# Patient Record
Sex: Female | Born: 1975 | Hispanic: No | Marital: Married | Smoking: Never smoker
Health system: Southern US, Community
[De-identification: ages and names within clinical notes are randomized; demographics above are authoritative.]

## PROBLEM LIST (undated history)

## (undated) HISTORY — PX: BREAST BIOPSY: SHX20

## (undated) HISTORY — PX: OTHER SURGICAL HISTORY: SHX169

---

## 2017-06-03 ENCOUNTER — Ambulatory Visit: Payer: Self-pay

## 2017-06-10 ENCOUNTER — Ambulatory Visit: Payer: Self-pay

## 2017-07-22 ENCOUNTER — Ambulatory Visit: Payer: BLUE CROSS/BLUE SHIELD | Attending: Oncology

## 2017-07-22 ENCOUNTER — Ambulatory Visit
Admission: RE | Admit: 2017-07-22 | Discharge: 2017-07-22 | Disposition: A | Discharge status: Home / Self Care | Payer: BLUE CROSS/BLUE SHIELD | Source: Ambulatory Visit | Attending: Oncology | Admitting: Oncology

## 2017-07-22 VITALS — Ht 62.0 in | Wt 113.0 lb

## 2017-07-22 DIAGNOSIS — Z0000 Encounter for general adult medical examination without abnormal findings: Secondary | ICD-10-CM

## 2017-07-22 NOTE — Progress Notes (Signed)
Subjective:     Patient ID: Leary RocaYanqin Trenkamp, female   DOB: November 21, 1975, 42 y.o.   MRN: [id number]  HPI   Review of Systems     Objective:   Physical Exam  Pulmonary/Chest: Right breast exhibits no inverted nipple, no mass, no nipple discharge, no skin change and no tenderness. Left breast exhibits no inverted nipple, no mass, no nipple discharge, no skin change and no tenderness. Breasts are symmetrical.  Bilateral center of nipple inversion.       Assessment:    42 year old asian patient presents for BCCCP clinic visit.  Patient speaks Mandarin.  Language line used to interpret exam. Patient screened, and meets BCCCP eligibility.  Patient does not have insurance, Medicare or Medicaid.  Handout given on Affordable Care Act.  Instructed patient on breast self awareness using teach back method.  CBE unremarkable.  No mass or lump palpated.  Patient reports nipples are inverted naturally, and she pulls them out.     Plan:     Sent for bilateral screening mammogram.  Courtesy car to transport patient.

## 2017-08-25 NOTE — Progress Notes (Signed)
Letter mailed from [facility] to notify of normal mammogram results.  Patient to return in one year for annual screening.  Copy to [facility]. 

## 2019-01-20 ENCOUNTER — Ambulatory Visit
Admission: EM | Admit: 2019-01-20 | Discharge: 2019-01-20 | Disposition: A | Discharge status: Home / Self Care | Payer: BLUE CROSS/BLUE SHIELD | Attending: Urgent Care | Admitting: Urgent Care

## 2019-01-20 ENCOUNTER — Encounter: Payer: Self-pay | Admitting: Emergency Medicine

## 2019-01-20 ENCOUNTER — Other Ambulatory Visit: Payer: Self-pay

## 2019-01-20 DIAGNOSIS — R197 Diarrhea, unspecified: Secondary | ICD-10-CM | POA: Diagnosis not present

## 2019-01-20 DIAGNOSIS — R1084 Generalized abdominal pain: Secondary | ICD-10-CM

## 2019-01-20 LAB — URINALYSIS, COMPLETE (UACMP) WITH MICROSCOPIC
Bacteria, UA: NONE SEEN
Bilirubin Urine: NEGATIVE
Glucose, UA: NEGATIVE mg/dL
Ketones, ur: NEGATIVE mg/dL
Leukocytes,Ua: NEGATIVE
Nitrite: NEGATIVE
Protein, ur: NEGATIVE mg/dL
Specific Gravity, Urine: 1.01 (ref 1.005–1.030)
pH: 7 (ref 5.0–8.0)

## 2019-01-20 NOTE — Discharge Instructions (Addendum)
It was very nice seeing you today in clinic. Thank you for entrusting me with your care.  ° °Please utilize the medications that we discussed. Your prescriptions have been called in to your pharmacy.  ° °If your symptoms/condition worsens, please seek follow up care either here or in the ER. Please remember, our [place] providers are "right here with you" when you need us.  ° °Again, it was my pleasure to take care of you today. Thank you for choosing our clinic. I hope that you start to feel better quickly.  ° °Kaileah Shevchenko, MSN, APRN, FNP-C, CEN °Advanced Practice Provider °[facility] °

## 2019-01-20 NOTE — ED Provider Notes (Signed)
[place], [place]   Name: Grace Boyer DOB: Nov 16, 1975 MRN: [id number] CSN: [id number] PCP: Patient, No Pcp Per  Arrival date and time:  01/20/19 1314  Chief Complaint:  Abdominal Pain and Diarrhea   NOTE: Prior to seeing the patient today, I have reviewed the triage nursing documentation and vital signs. Clinical staff has updated patient's PMH/PSHx, current medication list, and drug allergies/intolerances to ensure comprehensive history available to assist in medical decision making.   History:    History assisted by tele-interpreter Grace Boyer [phone]).  HPI: Grace Boyer is a 43 y.o. female who presents today with complaints of diarrhea for the last 3 weeks. Patient noting that soon after eating, she feels like she has to have a bowel movement, but she is only going very small amounts at a time.  Patient noting undigested food in her stools. She reports that her stools are dark in color; denies blood or mucus..  Patient complains of intermittent diffuse abdominal pain.  She denies any nausea, vomiting, fever, or chills.  She denies concurrent urinary symptoms. Patient has known PUD.  She was treated for H. pylori in [place] earlier this year prior to moving to [place].  Patient treated with "pills for 2 weeks", which initially improved her symptoms.  Since that time, patient has had intermittent diarrhea, however notes that  She has been stool small amounts daily for the last 3 weeks.  Patient unable to advise on the number of stools she is having on a daily basis. Patient notes that she has been eating and drinking well.  She denies resulting weakness related to her daily diarrhea.  Of note, patient has not established care with gastroenterology since moving to [facility] citing difficulties making an appointment due to her language barrier and current COVID pandemic. Patient has not had follow-up H. pylori eradication testing. Patient presents today requesting EGD and colonoscopy to be  performed.  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  History reviewed. No pertinent past medical history.  Past Surgical History:  Procedure Laterality Date   BREAST BIOPSY Right    neg   BREAST BIOPSY Left    neg    Family History  Problem Relation Age of Onset   Healthy Mother    Healthy Father    Breast cancer Neg Hx     Social History   Tobacco Use   Smoking status: Never Smoker   Smokeless tobacco: Never Used  Substance Use Topics   Alcohol use: Not Currently   Drug use: Not Currently    There are no active problems to display for this patient.   Home Medications:    No outpatient medications have been marked as taking for the 01/20/19 encounter [facility] Encounter).    Allergies:   Patient has no known allergies.  Review of Systems (ROS): Review of Systems  Constitutional: Negative for fatigue and fever.  HENT: Negative for congestion, ear pain, postnasal drip, rhinorrhea, sinus pressure, sinus pain, sneezing and sore throat.   Eyes: Negative for pain, discharge and redness.  Respiratory: Negative for cough, chest tightness and shortness of breath.   Cardiovascular: Negative for chest pain and palpitations.  Gastrointestinal: Positive for abdominal pain and diarrhea. Negative for nausea and vomiting.  Musculoskeletal: Negative for arthralgias, back pain, myalgias and neck pain.  Skin: Negative for color change, pallor and rash.  Neurological: Negative for dizziness, syncope, weakness and  headaches.  Hematological: Negative for adenopathy.  All other systems reviewed and are negative.    Vital Signs: Today's Vitals   01/20/19 1358 01/20/19 1405 01/20/19 1515  BP:  120/83   Pulse:  95   Resp:  18   Temp:  98.7 F (37.1 C)   TempSrc:  Oral   SpO2:  100%   Weight: 115 lb (52.2 kg)    Height: 5\' 1"  (1.549 m)    PainSc: 0-No pain  0-No pain     Physical Exam: Physical Exam  Constitutional: She is oriented to person, place, and time and well-developed, well-nourished, and in no distress. No distress.  HENT:  Head: Normocephalic and atraumatic.  Nose: Nose normal.  Mouth/Throat: Oropharynx is clear and moist.  Eyes: Pupils are equal, round, and reactive to light. Conjunctivae and EOM are normal.  Neck: Normal range of motion. Neck supple. No tracheal deviation present.  Cardiovascular: Normal rate, regular rhythm, normal heart sounds and intact distal pulses. Exam reveals no gallop and no friction rub.  No murmur heard. Pulmonary/Chest: Effort normal and breath sounds normal. No respiratory distress. She has no wheezes. She has no rales.  Abdominal: Soft. Bowel sounds are normal. She exhibits no distension. There is no abdominal tenderness.  Musculoskeletal: Normal range of motion.  Lymphadenopathy:    She has no cervical adenopathy.  Neurological: She is alert and oriented to person, place, and time. Gait normal. GCS score is 15.  Skin: Skin is warm and dry. No rash noted. She is not diaphoretic.  Psychiatric: Mood, memory, affect and judgment normal.  Nursing note and vitals reviewed.   Urgent Care Treatments / Results:   LABS: PLEASE NOTE: all labs that were ordered this encounter are listed, however only abnormal results are displayed. Labs Reviewed  URINALYSIS, COMPLETE (UACMP) WITH MICROSCOPIC - Abnormal; Notable for the following components:      Result Value   Color, Urine STRAW (*)    Hgb urine dipstick MODERATE (*)    All other components within normal limits    EKG: -None  RADIOLOGY: No results found.  PROCEDURES: Procedures  MEDICATIONS RECEIVED THIS VISIT: Medications - No data to display  PERTINENT CLINICAL COURSE NOTES/UPDATES:   Initial Impression / Assessment and Plan / Urgent Care Course:  Pertinent labs & imaging results that were available during my care of the patient were personally  reviewed by me and considered in my medical decision making (see lab/imaging section of note for values and interpretations).  Grace Boyer is a 43 y.o. female who presents to [facility] today with complaints of Abdominal Pain and Diarrhea   Patient is well appearing overall in clinic today. She does not appear to be in any acute distress. Presenting symptoms (see HPI) and exam as documented above.  Consult with this patient was very difficult today due to language barrier; patient only speaks [place]and[place] Chinese.  HPI and assessment was facilitated by tele-interpreter.  Patient notes that she is not having abdominal pain in clinic today.  She does not appear to be dehydrated; eating and drinking well. She is hemodynamically stable; no tachycardia or hypotension. We attempted to collect stool studies to assess for diarrhea of infectious etiology (C. difficile, GI panel, H. pylori stool Ag), however patient unable to provide sample in clinic.  Patient advised that she will need to collect stool studies at home and return them to the clinic for testing.  She was provided with a written prescription for the stool studies.  She was advised that treatment for her diarrhea would be deferred until test results received. Encouraged to avoid anti-diarrheals until stool studies reviewed. In the interim, patient was encouraged to increase fluid intake using electrolyte enriched fluids.  Additionally, she was encouraged to increase her intake of potassium rich foods; sources reviewed in clinic with patient.  Patient verbalized understanding.    Discussed with patient that she will need to be seen in consult by gastroenterology for consideration of EGD and colonoscopy.  Patient asking for EGD and colonoscopy to be performed here in this clinic.  Discussed nature of the urgent care and services provided.  Patient was advised that she will need to schedule a consult appointment with gastroenterology for consideration  of EGD and colonoscopy.  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  Call placed to [facility] to secure patient appointment.  Providers graciously agreed to see patient on Monday, 01/26/2019 at 130 in the [place] office.  Patient will be seeing Dr. Wyline MoodKiran Anna.  Patient was advised to discuss follow-up on pancreatic cyst with GI provider at the time of her appointment.  I have reviewed the follow up and strict return precautions for any new or worsening symptoms. Patient is aware of symptoms that would be deemed urgent/emergent, and would thus require further evaluation either here or in the emergency department. At the time of discharge, she verbalized understanding and consent with the discharge plan as it was reviewed with her. All questions were fielded by provider and/or clinic staff prior to patient discharge.    Final Clinical Impressions / Urgent Care Diagnoses:   Final diagnoses:  Diarrhea, unspecified type  Generalized abdominal pain    New Prescriptions:  [place] Controlled Substance Registry consulted? Not Applicable  No orders of the defined types were placed in this encounter.   Recommended Follow up Care:  Patient encouraged to follow up with the following provider within the specified time frame, or sooner as dictated by the severity of her symptoms. As always, she was instructed that for any urgent/emergent care needs, she should seek care either here or in the emergency department for more immediate evaluation.  Follow-up Information    Wyline MoodAnna, Kiran, MD.   Specialty: Gastroenterology Why: Your have an appointment at 1:30 Contact information: [address] [place] [address] [phone]         NOTE: This note was prepared using Dragon dictation software along with smaller phrase technology. Despite my best ability to proofread, there is the potential that  transcriptional errors may still occur from this process, and are completely unintentional.    Verlee MonteGray, Destini Cambre E, NP 01/22/19 [id number]

## 2019-01-20 NOTE — ED Triage Notes (Signed)
Pt c/o diarrhea for about 3 weeks. She states that after she eats she feels like she needs to have a BM again and does not feel empty. She also noticed leaves in her stool. She also noticed that her stools look black. She has pain in the center of her abdomen. She had H pylori earlier in the year and was suppose to follow up in february but was not able to follow up due to covid.

## 2019-01-26 ENCOUNTER — Ambulatory Visit: Payer: BLUE CROSS/BLUE SHIELD | Admitting: Gastroenterology

## 2019-01-26 ENCOUNTER — Other Ambulatory Visit: Payer: Self-pay

## 2019-01-26 NOTE — Progress Notes (Deleted)
   Jonathon Bellows MD, MRCP(U.K) [address]  [place]  [place], [place] [address]  Main: [phone]  Fax: [phone]   Gastroenterology Consultation  Referring Provider:     Karen Kitchens, NP Primary Care Physician:  Patient, No Pcp Per Primary Gastroenterologist:  Dr. Jonathon Bellows  Reason for Consultation:     Diarrhea         HPI:   Annalisia Ingber is a 43 y.o. y/o female referred for Diarrhea. Speaks only Mandarin . H/o H pylori.  Seen at urgent care on 01/20/2019.   Treated in [place] in early 2020 for H pylori . Prior cyst on pancreas and was advised to follow up   No past medical history on file.  Past Surgical History:  Procedure Laterality Date  . BREAST BIOPSY Right    neg  . BREAST BIOPSY Left    neg    Prior to Admission medications   Not on File    Family History  Problem Relation Age of Onset  . Healthy Mother   . Healthy Father   . Breast cancer Neg Hx      Social History   Tobacco Use  . Smoking status: Never Smoker  . Smokeless tobacco: Never Used  Substance Use Topics  . Alcohol use: Not Currently  . Drug use: Not Currently    Allergies as of 01/26/2019  . (No Known Allergies)    Review of Systems:    All systems reviewed and negative except where noted in HPI.   Physical Exam:  LMP 01/16/2019 (Approximate)  Patient's last menstrual period was 01/16/2019 (approximate). Psych:  Alert and cooperative. Normal mood and affect. General:   Alert,  Well-developed, well-nourished, pleasant and cooperative in NAD Head:  Normocephalic and atraumatic. Eyes:  Sclera clear, no icterus.   Conjunctiva pink. Ears:  Normal auditory acuity. Nose:  No deformity, discharge, or lesions. Mouth:  No deformity or lesions,oropharynx pink & moist. Neck:  Supple; no masses or thyromegaly. Lungs:  Respirations even and unlabored.  Clear throughout to auscultation.   No wheezes, crackles, or rhonchi. No acute distress. Heart:  Regular rate and rhythm; no  murmurs, clicks, rubs, or gallops. Abdomen:  Normal bowel sounds.  No bruits.  Soft, non-tender and non-distended without masses, hepatosplenomegaly or hernias noted.  No guarding or rebound tenderness.    Neurologic:  Alert and oriented x3;  grossly normal neurologically. Skin:  Intact without significant lesions or rashes. No jaundice. Lymph Nodes:  No significant cervical adenopathy. Psych:  Alert and cooperative. Normal mood and affect.  Imaging Studies: No results found.  Assessment and Plan:   Phala Schraeder is a 43 y.o. y/o female has been referred for diarrhea. Previously been treated for H pylori - eradication not confirmed, abdominal pain and recalls having a pancreatic cyst that needs follow up    1. Pancreatic cyst : CT scan of the abdomen Pancreatic mass protocol  2. Diarrhea : Stool for C diff, PCR calprotectin  3. H pylori breath test in view of prior infection  4. EGD+colonoscopy to evaluate abdominal pain and diarrhea    Follow up in 6 weeks  Dr Jonathon Bellows MD,MRCP(U.K)

## 2019-01-28 ENCOUNTER — Other Ambulatory Visit
Admission: RE | Admit: 2019-01-28 | Discharge: 2019-01-28 | Disposition: A | Discharge status: Home / Self Care | Payer: BLUE CROSS/BLUE SHIELD | Source: Ambulatory Visit | Attending: Gastroenterology | Admitting: Gastroenterology

## 2019-01-28 DIAGNOSIS — R197 Diarrhea, unspecified: Secondary | ICD-10-CM | POA: Insufficient documentation

## 2019-01-28 DIAGNOSIS — R1084 Generalized abdominal pain: Secondary | ICD-10-CM | POA: Diagnosis not present

## 2019-01-28 LAB — C DIFFICILE QUICK SCREEN W PCR REFLEX
C Diff antigen: POSITIVE — AB
C Diff toxin: NEGATIVE

## 2019-01-28 LAB — CLOSTRIDIUM DIFFICILE BY PCR, REFLEXED: Toxigenic C. Difficile by PCR: NEGATIVE

## 2019-02-17 ENCOUNTER — Other Ambulatory Visit (HOSPITAL_COMMUNITY): Payer: Self-pay | Admitting: Gastroenterology

## 2019-02-17 ENCOUNTER — Ambulatory Visit: Payer: BLUE CROSS/BLUE SHIELD | Admitting: Family Medicine

## 2019-02-17 ENCOUNTER — Other Ambulatory Visit: Payer: Self-pay | Admitting: Gastroenterology

## 2019-02-17 DIAGNOSIS — R197 Diarrhea, unspecified: Secondary | ICD-10-CM

## 2019-02-17 DIAGNOSIS — R1084 Generalized abdominal pain: Secondary | ICD-10-CM

## 2019-03-03 ENCOUNTER — Other Ambulatory Visit: Payer: Self-pay

## 2019-03-03 ENCOUNTER — Ambulatory Visit
Admission: RE | Admit: 2019-03-03 | Discharge: 2019-03-03 | Disposition: A | Discharge status: Home / Self Care | Payer: BLUE CROSS/BLUE SHIELD | Source: Ambulatory Visit | Attending: Gastroenterology | Admitting: Gastroenterology

## 2019-03-03 ENCOUNTER — Other Ambulatory Visit: Payer: Self-pay | Admitting: Nurse Practitioner

## 2019-03-03 DIAGNOSIS — R1084 Generalized abdominal pain: Secondary | ICD-10-CM | POA: Insufficient documentation

## 2019-03-03 DIAGNOSIS — Z1231 Encounter for screening mammogram for malignant neoplasm of breast: Secondary | ICD-10-CM

## 2019-03-03 DIAGNOSIS — R197 Diarrhea, unspecified: Secondary | ICD-10-CM | POA: Insufficient documentation

## 2019-03-03 MED ORDER — IOHEXOL 300 MG/ML  SOLN
75.0000 mL | Freq: Once | INTRAMUSCULAR | Status: AC | PRN
  Administered 2019-03-03: 09:00:00 75 mL via INTRAVENOUS

## 2019-03-11 ENCOUNTER — Ambulatory Visit
Admission: RE | Admit: 2019-03-11 | Discharge: 2019-03-11 | Disposition: A | Discharge status: Home / Self Care | Payer: BLUE CROSS/BLUE SHIELD | Source: Ambulatory Visit | Attending: Nurse Practitioner | Admitting: Nurse Practitioner

## 2019-03-11 ENCOUNTER — Other Ambulatory Visit: Payer: Self-pay

## 2019-03-11 DIAGNOSIS — Z1231 Encounter for screening mammogram for malignant neoplasm of breast: Secondary | ICD-10-CM | POA: Diagnosis not present

## 2019-04-03 ENCOUNTER — Other Ambulatory Visit: Payer: Self-pay

## 2019-04-03 ENCOUNTER — Other Ambulatory Visit
Admission: RE | Admit: 2019-04-03 | Discharge: 2019-04-03 | Disposition: A | Discharge status: Home / Self Care | Payer: BLUE CROSS/BLUE SHIELD | Source: Ambulatory Visit | Attending: Internal Medicine | Admitting: Internal Medicine

## 2019-04-03 DIAGNOSIS — Z01812 Encounter for preprocedural laboratory examination: Secondary | ICD-10-CM | POA: Insufficient documentation

## 2019-04-03 DIAGNOSIS — Z20828 Contact with and (suspected) exposure to other viral communicable diseases: Secondary | ICD-10-CM | POA: Insufficient documentation

## 2019-04-03 LAB — SARS CORONAVIRUS 2 (TAT 6-24 HRS): SARS Coronavirus 2: NEGATIVE

## 2019-04-08 ENCOUNTER — Ambulatory Visit
Admission: RE | Admit: 2019-04-08 | Discharge: 2019-04-08 | Disposition: A | Discharge status: Home / Self Care | Payer: BLUE CROSS/BLUE SHIELD | Attending: Internal Medicine | Admitting: Internal Medicine

## 2019-04-08 ENCOUNTER — Ambulatory Visit: Payer: BLUE CROSS/BLUE SHIELD | Admitting: Anesthesiology

## 2019-04-08 ENCOUNTER — Other Ambulatory Visit: Payer: Self-pay

## 2019-04-08 ENCOUNTER — Encounter: Payer: Self-pay | Admitting: *Deleted

## 2019-04-08 ENCOUNTER — Encounter
Admission: RE | Disposition: A | Discharge status: Home / Self Care | Payer: Self-pay | Source: Home / Self Care | Attending: Internal Medicine

## 2019-04-08 DIAGNOSIS — K529 Noninfective gastroenteritis and colitis, unspecified: Secondary | ICD-10-CM | POA: Insufficient documentation

## 2019-04-08 DIAGNOSIS — K219 Gastro-esophageal reflux disease without esophagitis: Secondary | ICD-10-CM | POA: Insufficient documentation

## 2019-04-08 DIAGNOSIS — R1013 Epigastric pain: Secondary | ICD-10-CM | POA: Insufficient documentation

## 2019-04-08 DIAGNOSIS — A0472 Enterocolitis due to Clostridium difficile, not specified as recurrent: Secondary | ICD-10-CM | POA: Diagnosis not present

## 2019-04-08 HISTORY — PX: ESOPHAGOGASTRODUODENOSCOPY (EGD) WITH PROPOFOL: SHX5813

## 2019-04-08 HISTORY — PX: COLONOSCOPY WITH PROPOFOL: SHX5780

## 2019-04-08 LAB — POCT PREGNANCY, URINE: Preg Test, Ur: NEGATIVE

## 2019-04-08 SURGERY — COLONOSCOPY WITH PROPOFOL
Anesthesia: General

## 2019-04-08 MED ORDER — MIDAZOLAM HCL 2 MG/2ML IJ SOLN
INTRAMUSCULAR | Status: DC | PRN
  Administered 2019-04-08: 2 mg via INTRAVENOUS

## 2019-04-08 MED ORDER — LIDOCAINE HCL (PF) 1 % IJ SOLN
INTRAMUSCULAR | Status: AC
  Administered 2019-04-08: 09:00:00 0.5 mL
  Filled 2019-04-08: qty 2

## 2019-04-08 MED ORDER — FENTANYL CITRATE (PF) 100 MCG/2ML IJ SOLN
INTRAMUSCULAR | Status: AC
  Filled 2019-04-08: qty 2

## 2019-04-08 MED ORDER — FENTANYL CITRATE (PF) 100 MCG/2ML IJ SOLN
INTRAMUSCULAR | Status: DC | PRN
  Administered 2019-04-08: 50 ug via INTRAVENOUS

## 2019-04-08 MED ORDER — PHENYLEPHRINE HCL (PRESSORS) 10 MG/ML IV SOLN
INTRAVENOUS | Status: DC | PRN
  Administered 2019-04-08 (×3): 100 ug via INTRAVENOUS
  Administered 2019-04-08: 50 ug via INTRAVENOUS
  Administered 2019-04-08: 100 ug via INTRAVENOUS

## 2019-04-08 MED ORDER — PROPOFOL 500 MG/50ML IV EMUL
INTRAVENOUS | Status: DC | PRN
  Administered 2019-04-08: 85 ug/kg/min via INTRAVENOUS

## 2019-04-08 MED ORDER — ONDANSETRON HCL 4 MG/2ML IJ SOLN
INTRAMUSCULAR | Status: DC | PRN
  Administered 2019-04-08: 4 mg via INTRAVENOUS

## 2019-04-08 MED ORDER — MIDAZOLAM HCL 2 MG/2ML IJ SOLN
INTRAMUSCULAR | Status: AC
  Filled 2019-04-08: qty 2

## 2019-04-08 MED ORDER — SODIUM CHLORIDE 0.9 % IV SOLN
INTRAVENOUS | Status: DC
  Administered 2019-04-08: 09:00:00 via INTRAVENOUS

## 2019-04-08 MED ORDER — PROPOFOL 10 MG/ML IV BOLUS
INTRAVENOUS | Status: DC | PRN
  Administered 2019-04-08: 50 mg via INTRAVENOUS

## 2019-04-08 NOTE — Op Note (Signed)
[facility] Gastroenterology Patient Name: Grace Boyer Procedure Date: 04/08/2019 9:27 AM MRN: [id number] Account #: [id number] Date of Birth: [birth year] Admit Type: Outpatient Age: 43 Room: [facility] ENDO ROOM 3 Gender: Female Note Status: Finalized Procedure:             Upper GI endoscopy Indications:           Epigastric abdominal pain Providers:             Benay Pike. [place] MD, MD Medicines:             Propofol per Anesthesia Complications:         No immediate complications. Procedure:             Pre-Anesthesia Assessment:                        - The risks and benefits of the procedure and the                         sedation options and risks were discussed with the                         patient. All questions were answered and informed                         consent was obtained.                        - Patient identification and proposed procedure were                         verified prior to the procedure by the nurse. The                         procedure was verified in the procedure room.                        - ASA Grade Assessment: II - A patient with mild                         systemic disease.                        - After reviewing the risks and benefits, the patient                         was deemed in satisfactory condition to undergo the                         procedure.                        After obtaining informed consent, the endoscope was                         passed under direct vision. Throughout the procedure,                         the patient's blood pressure, pulse, and oxygen  saturations were monitored continuously. The Endoscope                         was introduced through the mouth, and advanced to the                         third part of duodenum. The upper GI endoscopy was                         accomplished without difficulty. The patient tolerated                         the  procedure well. Findings:      The esophagus was normal.      The stomach was normal.      The examined duodenum was normal. Impression:            - Normal esophagus.                        - Normal stomach.                        - Normal examined duodenum.                        - No specimens collected. Recommendation:        - Proceed with colonoscopy Procedure Code(s):     --- Professional ---                        [phone], Esophagogastroduodenoscopy, flexible,                         transoral; diagnostic, including collection of                         specimen(s) by brushing or washing, when performed                         (separate procedure) Diagnosis Code(s):     --- Professional ---                        R10.13, Epigastric pain CPT copyright 2019 American Medical Association. All rights reserved. The codes documented in this report are preliminary and upon coder review may  be revised to meet current compliance requirements. Stanton Kidney MD, MD 04/08/2019 9:48:21 AM This report has been signed electronically. Number of Addenda: 0 Note Initiated On: 04/08/2019 9:27 AM Estimated Blood Loss:  Estimated blood loss: none.      [facility]

## 2019-04-08 NOTE — Transfer of Care (Signed)
Immediate Anesthesia Transfer of Care Note  Patient: Grace Boyer  Procedure(s) Performed: COLONOSCOPY WITH PROPOFOL (N/A ) ESOPHAGOGASTRODUODENOSCOPY (EGD) WITH PROPOFOL (N/A )  Patient Location: PACU  Anesthesia Type:General  Level of Consciousness: oriented and sedated  Airway & Oxygen Therapy: Patient Spontanous Breathing  Post-op Assessment: Report given to RN and Post -op Vital signs reviewed and stable  Post vital signs: Reviewed and stable  Last Vitals:  Vitals Value Taken Time  BP    Temp    Pulse 66 04/08/19 1006  Resp 19 04/08/19 1006  SpO2 98 % 04/08/19 1006  Vitals shown include unvalidated device data.  Last Pain: There were no vitals filed for this visit.       Complications: No apparent anesthesia complications

## 2019-04-08 NOTE — Progress Notes (Signed)
Patient complained of feeling dizzy after getting dressed. She was instructed to rest a bit longer on the stretcher and was provided a drink and graham crackers. Mandarin speaking interpreter remained present during this time. After about 10 minutes she stated that she was better and was ready to leave. She was discharged via wheelchair.

## 2019-04-08 NOTE — Interval H&P Note (Signed)
History and Physical Interval Note:  04/08/2019 8:37 AM  Grace Boyer  has presented today for surgery, with the diagnosis of diarrhea, abdominal pain.  The various methods of treatment have been discussed with the patient and family. After consideration of risks, benefits and other options for treatment, the patient has consented to  Procedure(s): COLONOSCOPY WITH PROPOFOL (N/A) ESOPHAGOGASTRODUODENOSCOPY (EGD) WITH PROPOFOL (N/A) as a surgical intervention.  The patient's history has been reviewed, patient examined, no change in status, stable for surgery.  I have reviewed the patient's chart and labs.  Questions were answered to the patient's satisfaction.     [place], [place]

## 2019-04-08 NOTE — Anesthesia Post-op Follow-up Note (Signed)
Anesthesia QCDR form completed.        

## 2019-04-08 NOTE — Anesthesia Postprocedure Evaluation (Signed)
Anesthesia Post Note  Patient: Grace Boyer  Procedure(s) Performed: COLONOSCOPY WITH PROPOFOL (N/A ) ESOPHAGOGASTRODUODENOSCOPY (EGD) WITH PROPOFOL (N/A )  Patient location during evaluation: Endoscopy Anesthesia Type: General Level of consciousness: awake and alert and oriented Pain management: pain level controlled Vital Signs Assessment: post-procedure vital signs reviewed and stable Respiratory status: spontaneous breathing Cardiovascular status: blood pressure returned to baseline Anesthetic complications: no     Last Vitals:  Vitals:   04/08/19 0859 04/08/19 1008  BP: 121/89 (!) 86/56  Pulse: 85 65  Resp: 16 19  Temp: (!) 36.2 C 36.4 C  SpO2: 100% 99%    Last Pain:  Vitals:   04/08/19 1038  TempSrc:   PainSc: 0-No pain                 Euphemia Lingerfelt

## 2019-04-08 NOTE — H&P (Signed)
Outpatient short stay form Pre-procedure 04/08/2019 8:36 AM Grace K. Alice Reichert, M.D.  Primary Physician: Gaetano Net, M.D.  Reason for visit:  Chronic diarrhea, Epigastric pain.  History of present illness:  As above. 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 Still having some diarrhea, not totally resolved. Has epigastric pain without melena.    No current facility-administered medications for this encounter.   Medications Prior to Admission  Medication Sig Dispense Refill Last Dose  . pantoprazole (PROTONIX) 20 MG tablet Take 20 mg by mouth daily.        No Known Allergies   No past medical history on file.  Review of systems:  Otherwise negative.    Physical Exam  Gen: Alert, oriented. Appears stated age.  HEENT: [place]/AT. PERRLA. Lungs: CTA, no wheezes. CV: RR nl S1, S2. Abd: soft, benign, no masses. BS+ Ext: No edema. Pulses 2+    Planned procedures: Proceed with EGD and colonoscopy. The patient understands the nature of the planned procedure, indications, risks, alternatives and potential complications including but not limited to bleeding, infection, perforation, damage to internal organs and possible oversedation/side effects from anesthesia. The patient agrees and gives consent to proceed.  Please refer to procedure notes for findings, recommendations and patient disposition/instructions.     Grace K. Alice Reichert, M.D. Gastroenterology 04/08/2019  8:36 AM

## 2019-04-08 NOTE — Anesthesia Preprocedure Evaluation (Signed)
Anesthesia Evaluation  Patient identified by MRN, date of birth, ID band Patient awake    Reviewed: Allergy & Precautions, NPO status , Patient's Chart, lab work & pertinent test results  Airway Mallampati: II  TM Distance: >3 FB     Dental  (+) Teeth Intact   Pulmonary neg pulmonary ROS,    Pulmonary exam normal        Cardiovascular negative cardio ROS Normal cardiovascular exam     Neuro/Psych negative neurological ROS  negative psych ROS   GI/Hepatic Neg liver ROS, GERD  ,  Endo/Other  negative endocrine ROS  Renal/GU negative Renal ROS  negative genitourinary   Musculoskeletal negative musculoskeletal ROS (+)   Abdominal Normal abdominal exam  (+)   Peds negative pediatric ROS (+)  Hematology negative hematology ROS (+)   Anesthesia Other Findings   Reproductive/Obstetrics                             Anesthesia Physical Anesthesia Plan  ASA: II  Anesthesia Plan: General   Post-op Pain Management:    Induction: Intravenous  PONV Risk Score and Plan: Propofol infusion  Airway Management Planned: Nasal Cannula  Additional Equipment:   Intra-op Plan:   Post-operative Plan:   Informed Consent: I have reviewed the patients History and Physical, chart, labs and discussed the procedure including the risks, benefits and alternatives for the proposed anesthesia with the patient or authorized representative who has indicated his/her understanding and acceptance.     Dental advisory given  Plan Discussed with: CRNA and Surgeon  Anesthesia Plan Comments:         Anesthesia Quick Evaluation

## 2019-04-08 NOTE — Op Note (Signed)
[facility] Patient Name: Grace Boyer Procedure Date: 04/08/2019 9:26 AM MRN: [id number] Account #: [id number] Date of Birth: Apr 22, 1976 Admit Type: Outpatient Age: 43 Room: [facility] ENDO ROOM 3 Gender: Female Note Status: Finalized Procedure:             Colonoscopy Indications:           Functional diarrhea, Follow-up of pseudomembranous                         colitis Providers:             Benay Pike. Lanaiya Lantry MD, MD Medicines:             Propofol per Anesthesia Complications:         No immediate complications. Procedure:             Pre-Anesthesia Assessment:                        - The risks and benefits of the procedure and the                         sedation options and risks were discussed with the                         patient. All questions were answered and informed                         consent was obtained.                        - Patient identification and proposed procedure were                         verified prior to the procedure by the nurse. The                         procedure was verified in the procedure room.                        - ASA Grade Assessment: I - A normal, healthy patient.                        - After reviewing the risks and benefits, the patient                         was deemed in satisfactory condition to undergo the                         procedure.                        After obtaining informed consent, the colonoscope was                         passed under direct vision. Throughout the procedure,                         the patient's blood pressure, pulse, and oxygen  saturations were monitored continuously. The                         Colonoscope was introduced through the anus and                         advanced to the the terminal ileum, with                         identification of the appendiceal orifice and IC                         valve. The colonoscopy was  performed without                         difficulty. The patient tolerated the procedure well.                         The quality of the bowel preparation was excellent.                         The terminal ileum, ileocecal valve, appendiceal                         orifice, and rectum were photographed. Findings:      The perianal and digital rectal examinations were normal. Pertinent       negatives include normal sphincter tone and no palpable rectal lesions.      The entire examined colon appeared normal on direct and retroflexion       views.      The terminal ileum appeared normal. Biopsies were taken with a cold       forceps for histology.      Biopsies for histology were taken with a cold forceps from the right       colon, left colon and rectum for evaluation of microscopic colitis. Impression:            - The entire examined colon is normal on direct and                         retroflexion views.                        - The examined portion of the ileum was normal.                         Biopsied.                        - Biopsies were taken with a cold forceps from the                         right colon, left colon and rectum for evaluation of                         microscopic colitis. Recommendation:        - Patient has a contact number available for                         emergencies.  The signs and symptoms of potential                         delayed complications were discussed with the patient.                         Return to normal activities tomorrow. Written                         discharge instructions were provided to the patient.                        - Resume previous diet.                        - Continue present medications.                        - Await pathology results.                        - Repeat colonoscopy in 7 years for screening purposes.                        - Return to physician assistant PRN.                        - The  findings and recommendations were discussed with                         the patient. 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 All rights reserved. The codes documented in this report are preliminary and upon coder review may  be revised to meet current compliance requirements. Stanton Kidneyeodoro K Berlin Viereck MD, MD 04/08/2019 10:03:35 AM This report has been signed electronically. Number of Addenda: 0 Note Initiated On: 04/08/2019 9:26 AM Scope Withdrawal Time: 0 hours 4 minutes 59 seconds  Total Procedure Duration: 0 hours 8 minutes 12 seconds  Estimated Blood Loss:  Estimated blood loss: none.      [facility]

## 2019-04-09 ENCOUNTER — Encounter: Payer: Self-pay | Admitting: Internal Medicine

## 2019-04-09 LAB — SURGICAL PATHOLOGY

## 2020-03-14 ENCOUNTER — Other Ambulatory Visit: Payer: Self-pay | Admitting: Nurse Practitioner

## 2020-03-14 DIAGNOSIS — Z1231 Encounter for screening mammogram for malignant neoplasm of breast: Secondary | ICD-10-CM

## 2020-04-19 ENCOUNTER — Ambulatory Visit
Admission: RE | Admit: 2020-04-19 | Discharge: 2020-04-19 | Disposition: A | Discharge status: Home / Self Care | Payer: BLUE CROSS/BLUE SHIELD | Source: Ambulatory Visit | Attending: Nurse Practitioner | Admitting: Nurse Practitioner

## 2020-04-19 ENCOUNTER — Encounter (INDEPENDENT_AMBULATORY_CARE_PROVIDER_SITE_OTHER): Payer: Self-pay

## 2020-04-19 ENCOUNTER — Other Ambulatory Visit: Payer: Self-pay

## 2020-04-19 DIAGNOSIS — Z1231 Encounter for screening mammogram for malignant neoplasm of breast: Secondary | ICD-10-CM | POA: Diagnosis not present

## 2020-08-20 IMAGING — MG MM DIGITAL SCREENING BILAT W/ TOMO W/ CAD
8 series · 9 of 24 positions shown · non-contrast
Comparison: Previous exam(s).

CLINICAL DATA: Screening.

EXAM:
DIGITAL SCREENING BILATERAL MAMMOGRAM WITH TOMO AND CAD

[L MLO synth-2D]
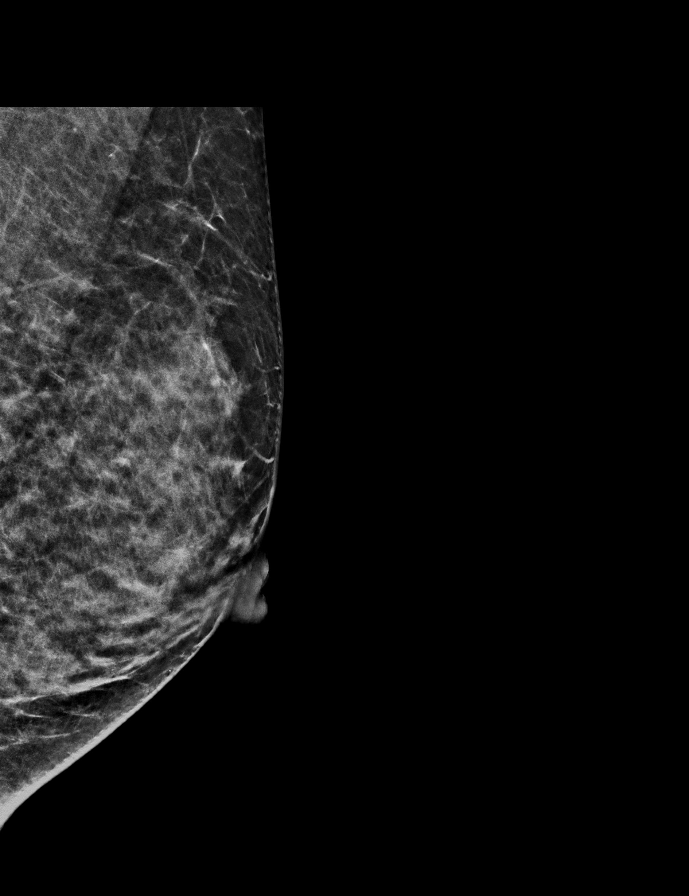

[L CC synth-2D]
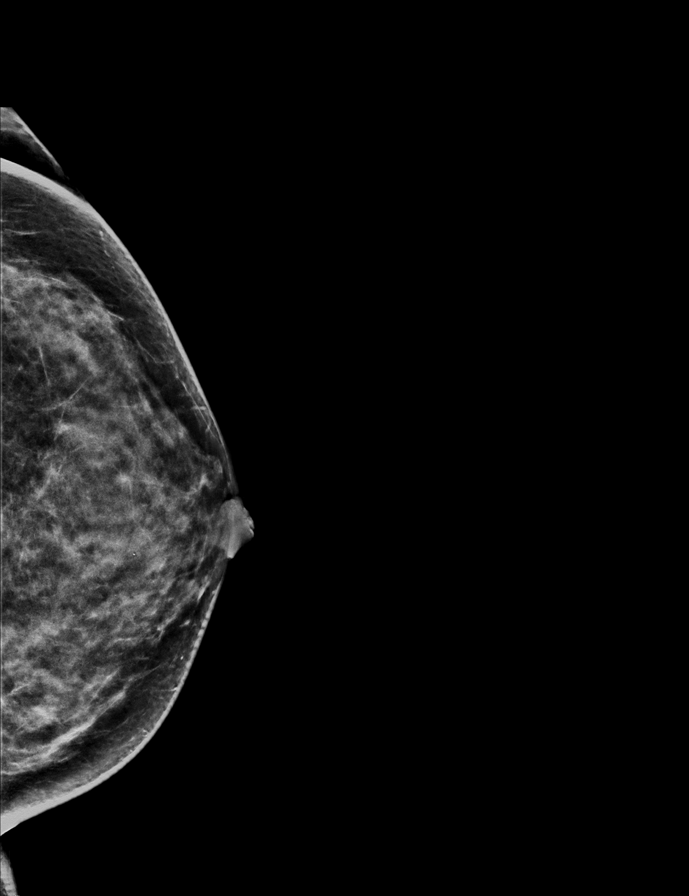

[R MLO synth-2D]
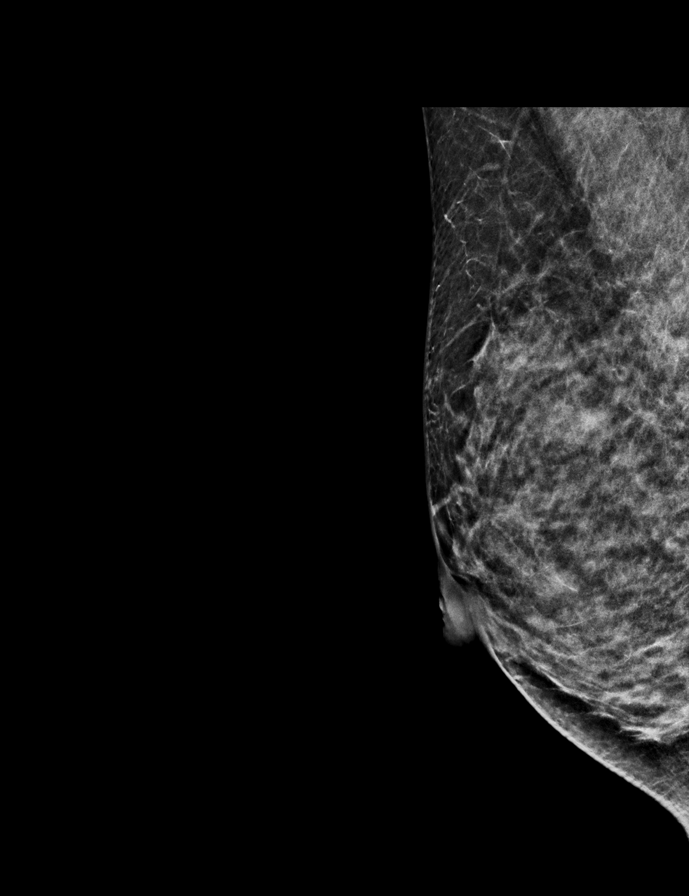

[R CC synth-2D]
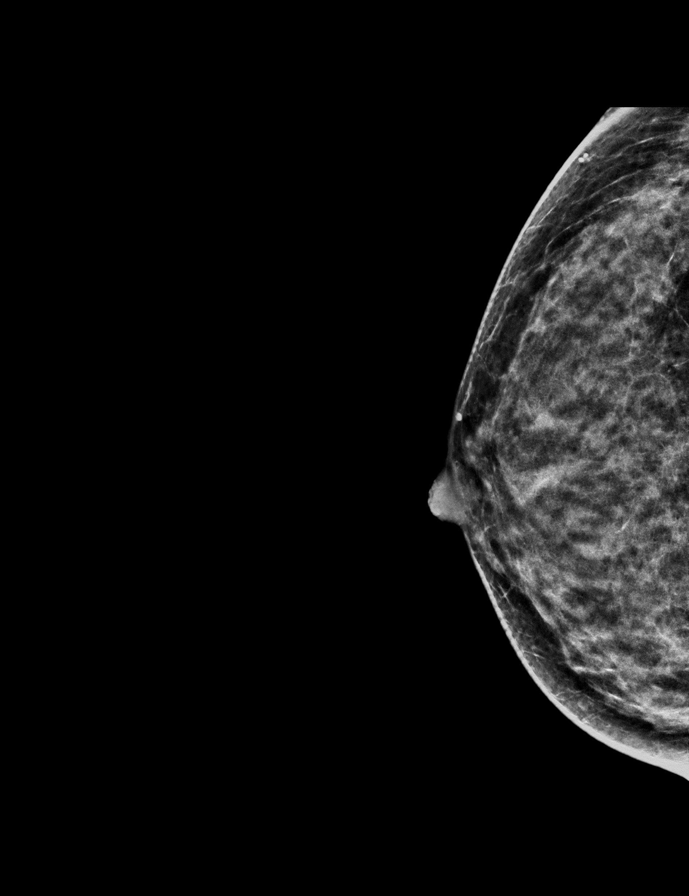

[L MLO tomo · 2 of 52 frames shown]
[frame 17/52]
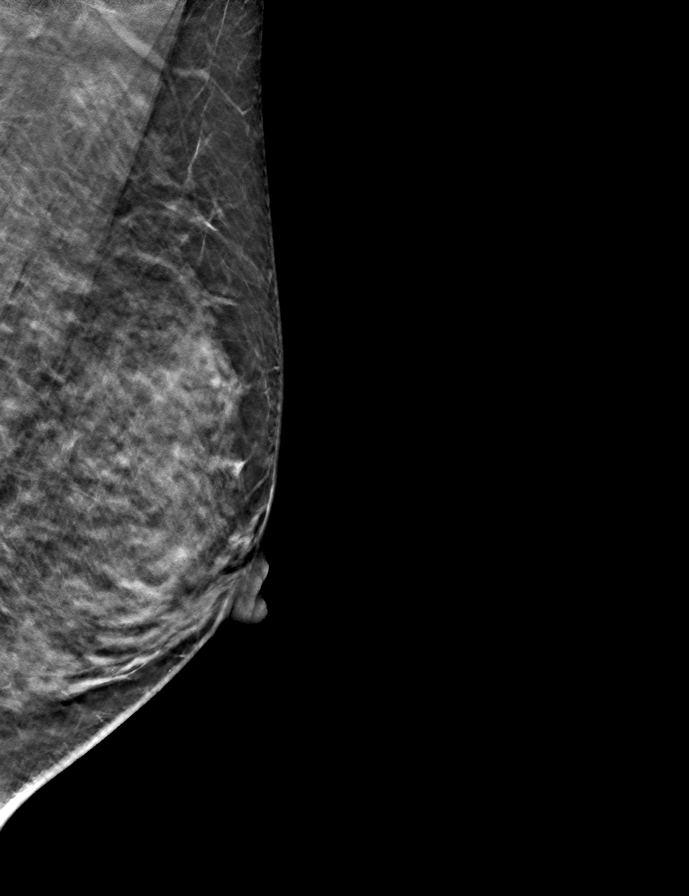
[frame 27/52]
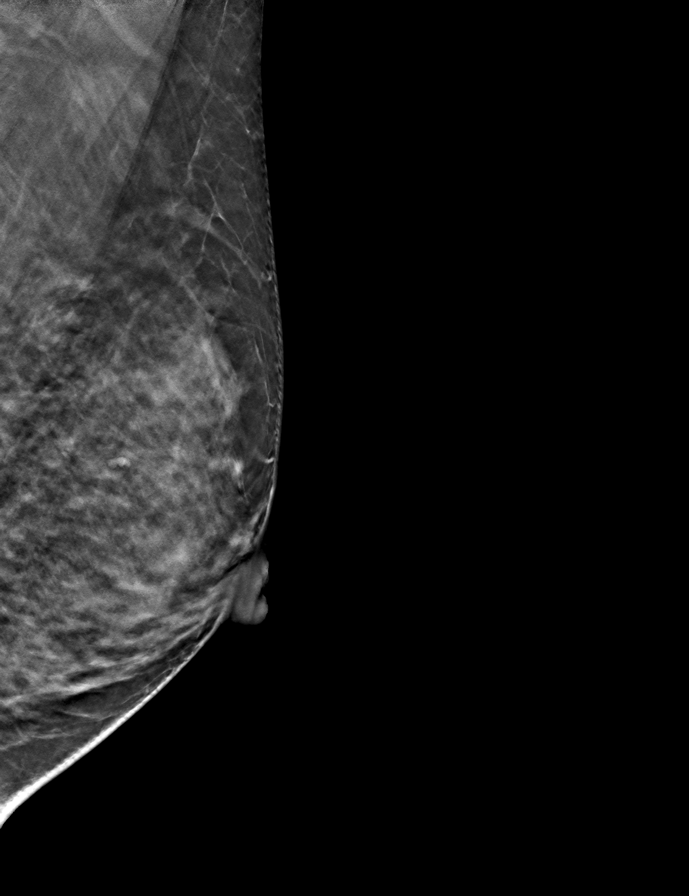

[R CC tomo · tomo slice 27/54.0]
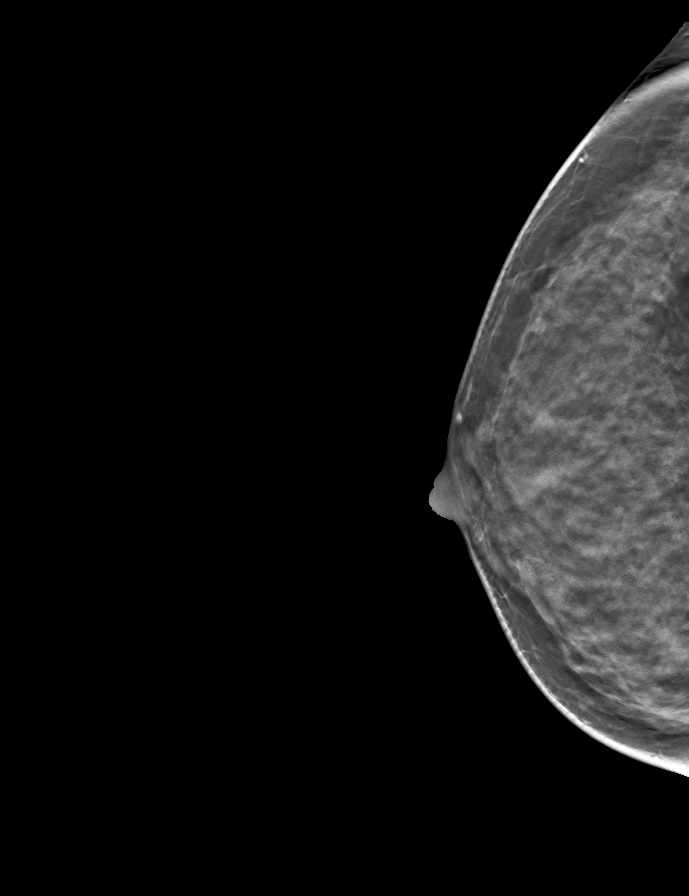

[R MLO tomo · tomo slice 27/52.0]
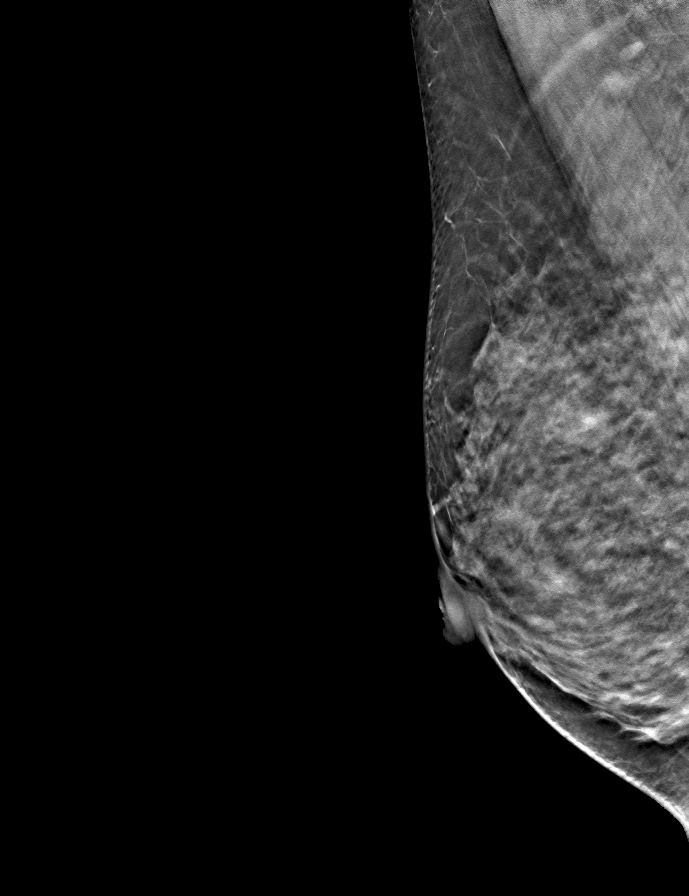

[L CC tomo · tomo slice 29/56.0]
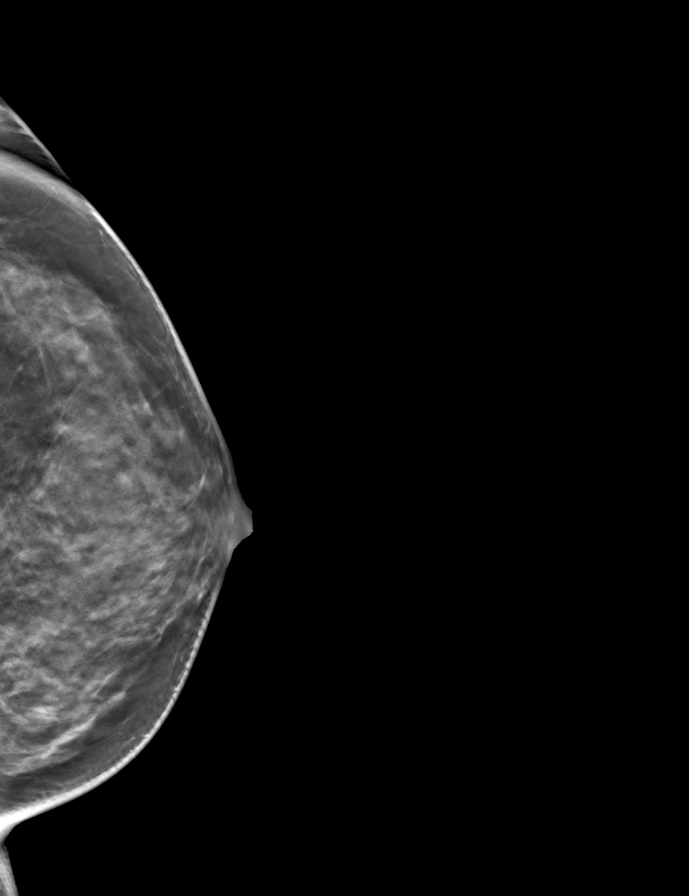

[9 of 24 positions shown; findings below may reference images not displayed]

ACR Breast Density Category c: The breast tissue is heterogeneously
dense, which may obscure small masses.
FINDINGS: There are no findings suspicious for malignancy. Images were
processed with CAD.
IMPRESSION: No mammographic evidence of malignancy. A result letter of this
screening mammogram will be mailed directly to the patient.

RECOMMENDATION:
Screening mammogram in one year. (Code:FT-U-LHB)

BI-RADS CATEGORY  1: Negative.

## 2021-04-25 ENCOUNTER — Ambulatory Visit: Payer: BLUE CROSS/BLUE SHIELD

## 2021-05-03 NOTE — Progress Notes (Signed)
Patient pre-screened for BCCCP eligibility due to COVID 19 precautions. Two patient identifiers used for verification that I was speaking to correct patient.  Patient to Present directly to [facility] 05/10/21 at 10:00, for BCCCP screening mammogram.

## 2021-05-09 ENCOUNTER — Ambulatory Visit: Payer: BLUE CROSS/BLUE SHIELD | Attending: Oncology

## 2021-05-09 ENCOUNTER — Other Ambulatory Visit: Payer: Self-pay

## 2021-05-09 ENCOUNTER — Ambulatory Visit
Admission: RE | Admit: 2021-05-09 | Discharge: 2021-05-09 | Disposition: A | Discharge status: Home / Self Care | Payer: Self-pay | Source: Ambulatory Visit | Attending: Oncology | Admitting: Oncology

## 2021-05-09 DIAGNOSIS — Z0000 Encounter for general adult medical examination without abnormal findings: Secondary | ICD-10-CM | POA: Insufficient documentation

## 2021-06-14 NOTE — Progress Notes (Signed)
Letter mailed from [facility] to notify of normal mammogram results.  Patient to return in one year for annual screening.  Copy to [facility]. 

## 2022-04-09 ENCOUNTER — Other Ambulatory Visit: Payer: Self-pay | Admitting: Family Medicine

## 2022-04-09 DIAGNOSIS — Z1231 Encounter for screening mammogram for malignant neoplasm of breast: Secondary | ICD-10-CM
